# Patient Record
Sex: Male | Born: 1980 | Race: White | Hispanic: No | Marital: Single | Smoking: Never smoker
Health system: Southern US, Community
[De-identification: ages and names within clinical notes are randomized; demographics above are authoritative.]

## PROBLEM LIST (undated history)

## (undated) DIAGNOSIS — F439 Reaction to severe stress, unspecified: Secondary | ICD-10-CM

## (undated) DIAGNOSIS — K219 Gastro-esophageal reflux disease without esophagitis: Secondary | ICD-10-CM

## (undated) DIAGNOSIS — R000 Tachycardia, unspecified: Secondary | ICD-10-CM

## (undated) DIAGNOSIS — R002 Palpitations: Secondary | ICD-10-CM

## (undated) HISTORY — DX: Tachycardia, unspecified: R00.0

## (undated) HISTORY — DX: Palpitations: R00.2

---
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
## 2004-05-18 ENCOUNTER — Emergency Department (HOSPITAL_COMMUNITY): Admission: EM | Admit: 2004-05-18 | Discharge: 2004-05-18 | Payer: Self-pay | Admitting: Emergency Medicine

## 2004-09-20 ENCOUNTER — Ambulatory Visit: Payer: Self-pay | Admitting: Family Medicine

## 2004-10-23 ENCOUNTER — Ambulatory Visit: Payer: Self-pay | Admitting: Family Medicine

## 2005-01-25 ENCOUNTER — Ambulatory Visit: Payer: Self-pay | Admitting: Family Medicine

## 2005-06-04 ENCOUNTER — Ambulatory Visit: Payer: Self-pay | Admitting: Family Medicine

## 2005-12-16 IMAGING — CT CT ORBIT/TEMPORAL/IAC W/O CM
4 series · 18 of 30 positions shown, 19 images · non-contrast
Comparison: none

HISTORY: Facial trauma, a salt, pain right orbit

[Series 2: brain · axial · 0.47mm/px · z∈[+136,+221]mm · 4 of 32 slices shown, 5 images]
[im 7/32  brain]
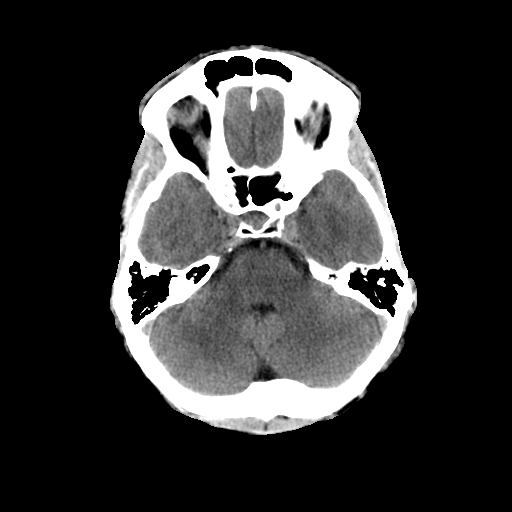
[im 7/32  bone]
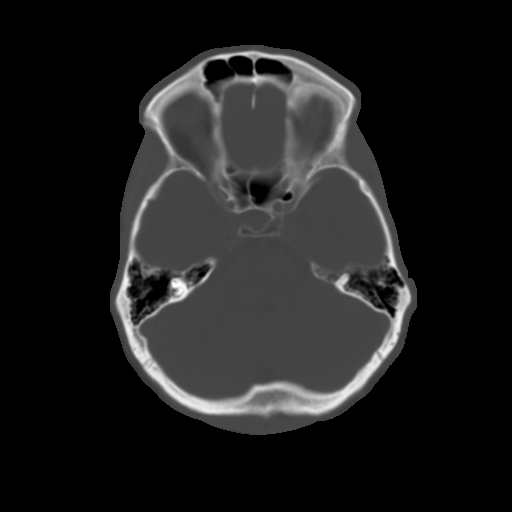
[im 13/32  bone]
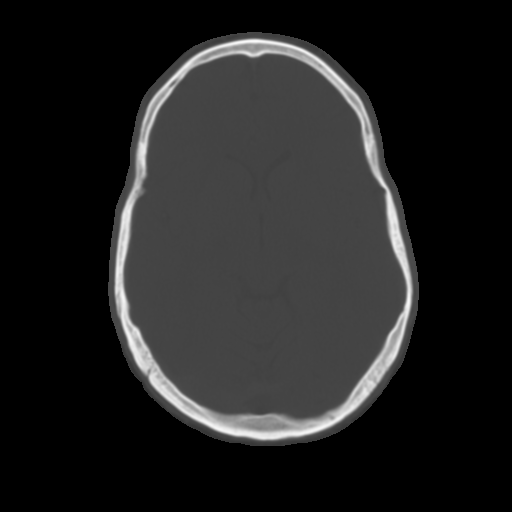
[im 19/32  bone]
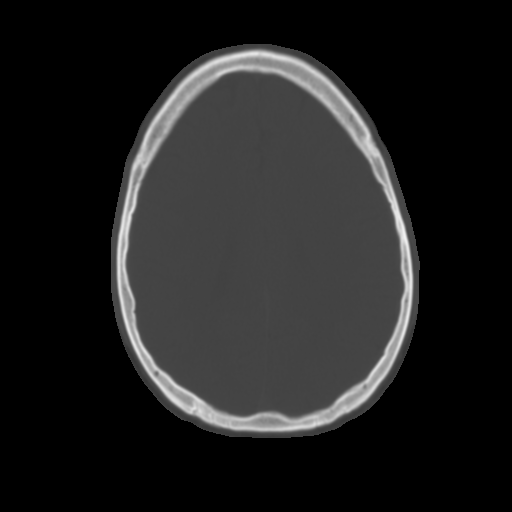
[im 25/32  bone]
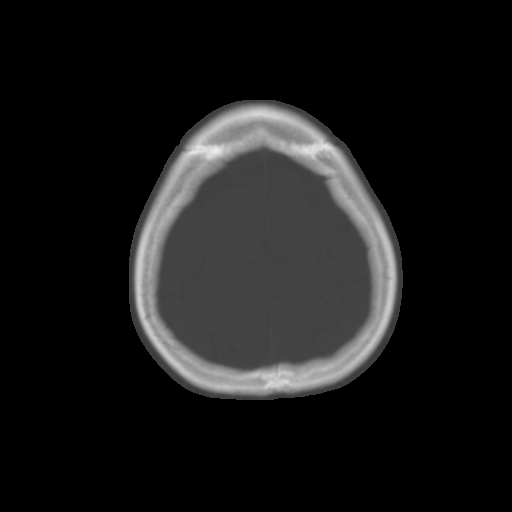

[Series 3: recon 2: brain · axial · 0.47mm/px · z∈[+136,+221]mm · 4 of 32 slices shown]
[im 7/32  bone]
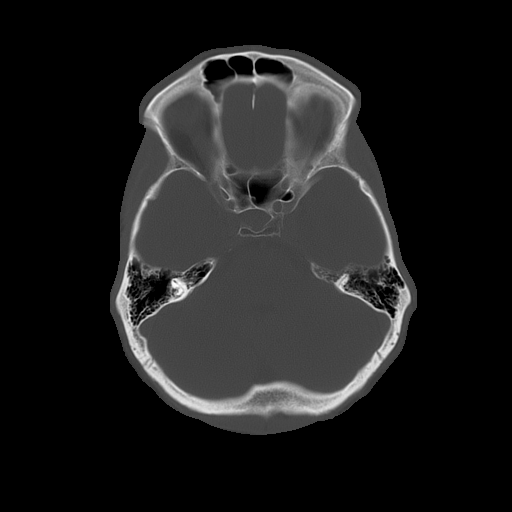
[im 13/32  bone]
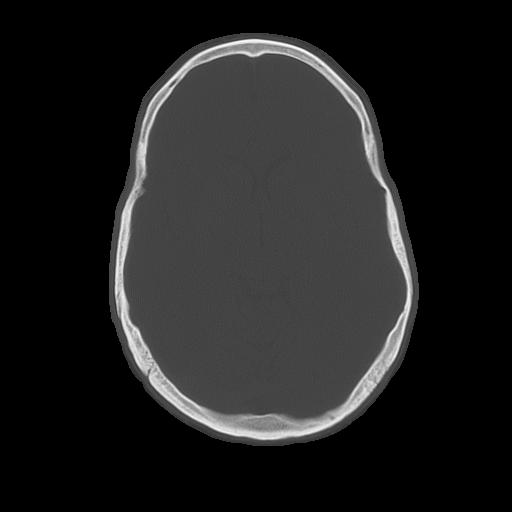
[im 19/32  bone]
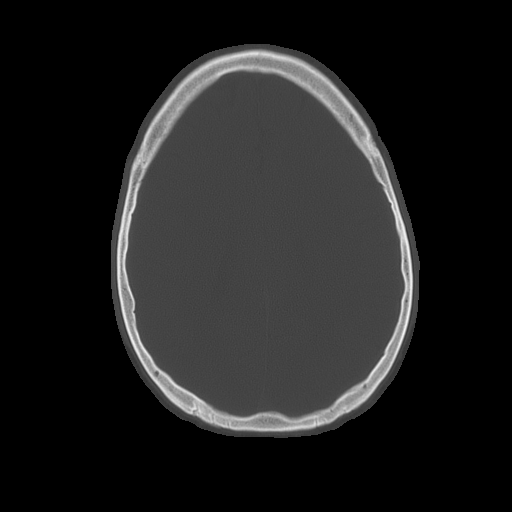
[im 25/32  bone]
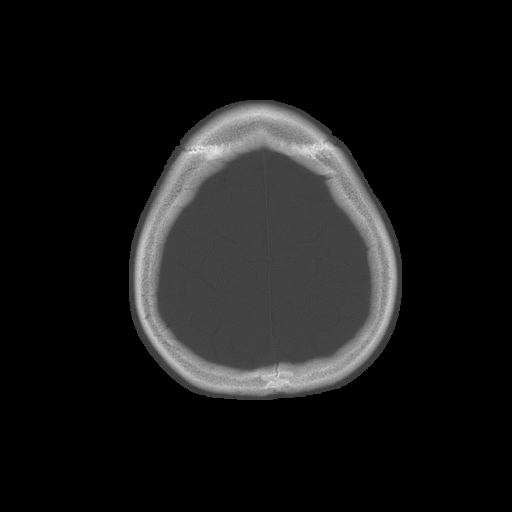

[Series 5: recon 2: supine facial bones · axial · 0.33mm/px · z∈[+56,+141]mm · 4 of 29 slices shown]
[im 6/29  bone]
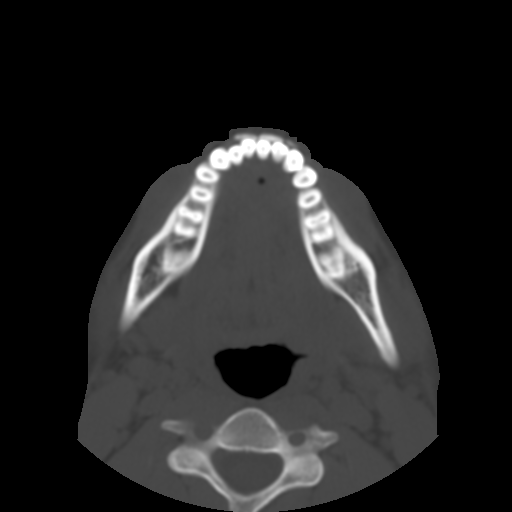
[im 12/29  bone]
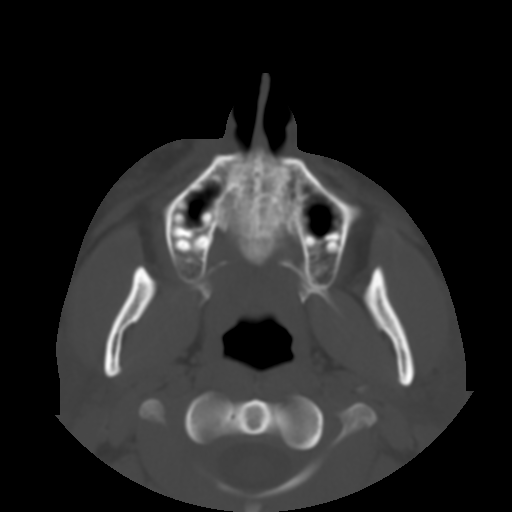
[im 17/29  bone]
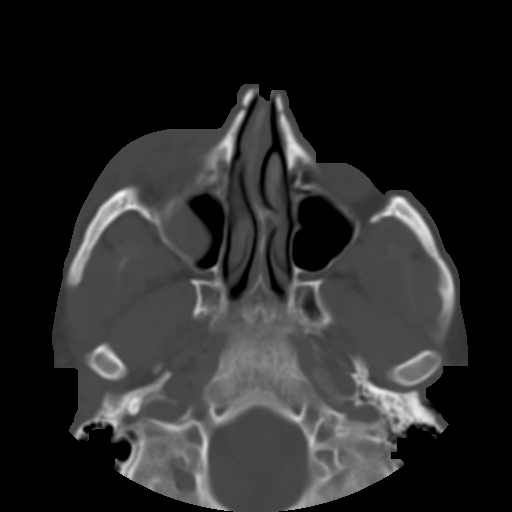
[im 23/29  bone]
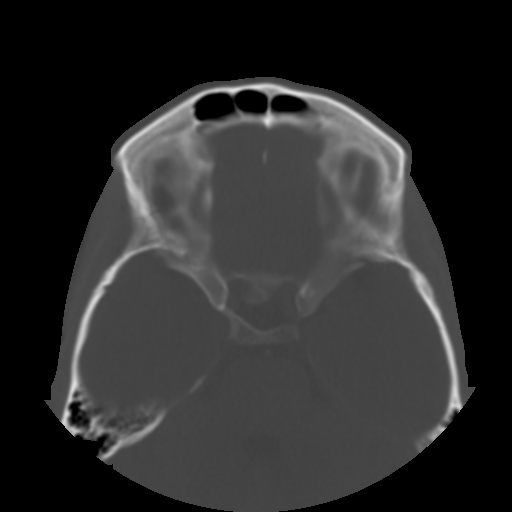

[Series 105: reformatted · coronal · 0.33mm/px · 6 of 60 slices shown]
[im 6/60  bone]
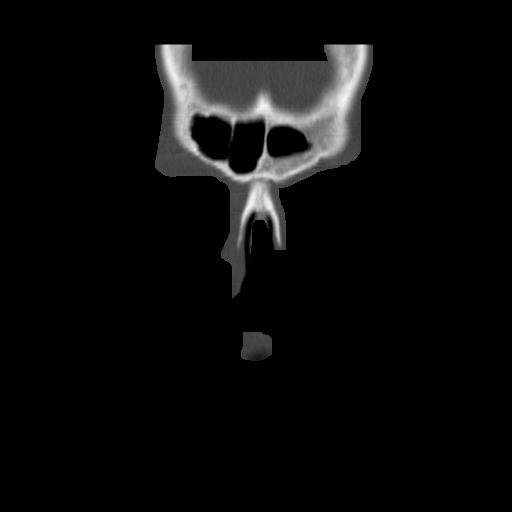
[im 11/60  bone]
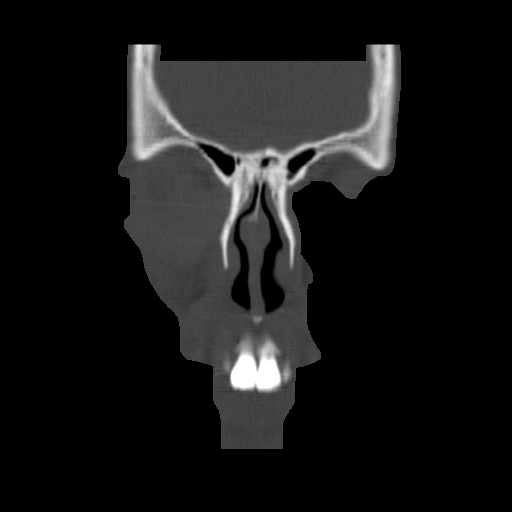
[im 22/60  bone]
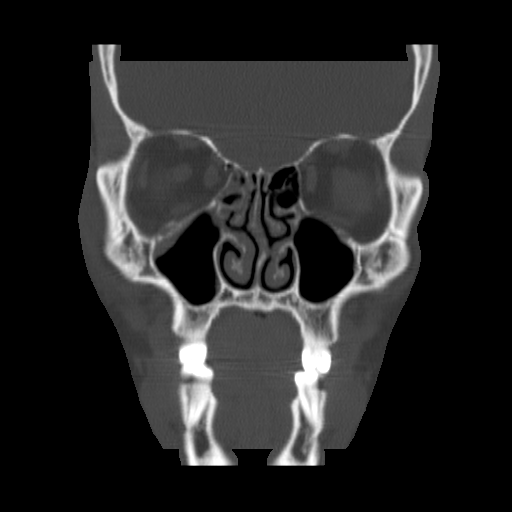
[im 27/60  bone]
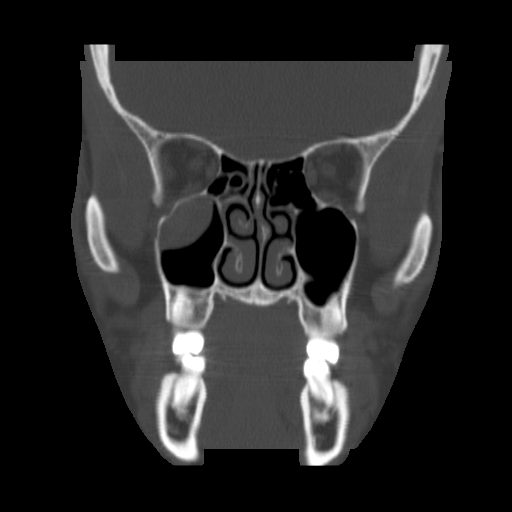
[im 33/60  bone]
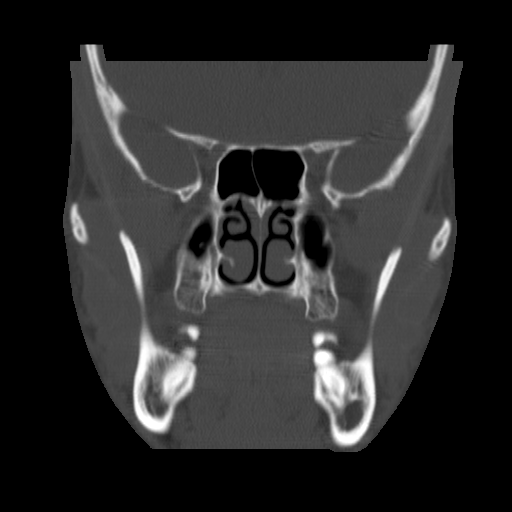
[im 38/60  bone]
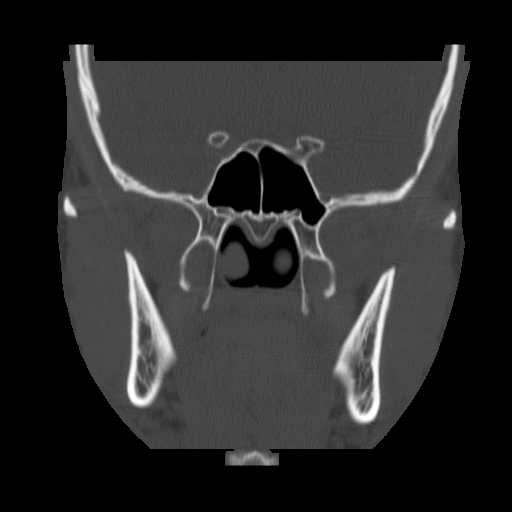

[18 of 30 positions shown; findings below may reference images not displayed]

CT ORBITS WITHOUT CONTRAST:

Axial and reconstructed coronal noncontrast CT images of the orbits performed.
Extensive periorbital soft tissue swelling right orbit.
Intraconal fat clear bilaterally.
Mildly displaced fracture of medial wall right orbit, depressed into the ethmoid
air cells.
Additional nondisplaced fracture through inferior wall right orbit into superior
and lateral aspect of maxillary sinus.
Associated soft tissue thickening, fluid, or blood in superolateral aspect of
right maxillary sinus.
Zygoma is intact.
Remaining orbital walls intact.
Mucosal thickening in right ethmoid air cells.
Left orbit intact and remaining sinuses clear.
IMPRESSION: Fractures of the medial and inferior walls of right orbit as above.
Extensive right periorbital and facial soft tissue swelling.

## 2010-08-20 ENCOUNTER — Ambulatory Visit (HOSPITAL_BASED_OUTPATIENT_CLINIC_OR_DEPARTMENT_OTHER)
Admission: RE | Admit: 2010-08-20 | Discharge: 2010-08-20 | Disposition: A | Discharge status: Home / Self Care | Payer: BC Managed Care – PPO | Source: Ambulatory Visit | Attending: Orthopedic Surgery | Admitting: Orthopedic Surgery

## 2010-08-20 DIAGNOSIS — W19XXXA Unspecified fall, initial encounter: Secondary | ICD-10-CM | POA: Insufficient documentation

## 2010-08-20 DIAGNOSIS — S62309A Unspecified fracture of unspecified metacarpal bone, initial encounter for closed fracture: Secondary | ICD-10-CM | POA: Insufficient documentation

## 2010-08-20 DIAGNOSIS — Z01812 Encounter for preprocedural laboratory examination: Secondary | ICD-10-CM | POA: Insufficient documentation

## 2010-08-20 LAB — POCT HEMOGLOBIN-HEMACUE: Hemoglobin: 16.2 g/dL (ref 13.0–17.0)

## 2010-08-25 NOTE — Op Note (Signed)
NAMEKANAAN, Franklin Adams NO.:  [id number]  MEDICAL RECORD NO.:  [id number]           PATIENT TYPE:  LOCATION:                                 FACILITY:  PHYSICIAN:  Betha Loa, MD             DATE OF BIRTH:  DATE OF PROCEDURE:  08/20/2010 DATE OF DISCHARGE:                              OPERATIVE REPORT   PREOPERATIVE DIAGNOSIS:  Right long and ring finger metacarpal fractures.  POSTOPERATIVE DIAGNOSIS:  Right long and ring finger metacarpal fractures.  PROCEDURE:  Open reduction and internal fixation right long and ring finger metacarpal fracture.  SURGEON:  Betha Loa, MD  ASSISTANTS:  None.  ANESTHESIA:  General.  IV FLUIDS:  Per anesthesia flow sheet.  ESTIMATED BLOOD LOSS:  Minimal.  COMPLICATIONS:  None.  SPECIMENS:  None.  TOURNIQUET TIME:  109 minutes.  DISPOSITION:  Stable to PACU.  INDICATIONS:  Franklin Adams is a 30 year old white male who on August 16, 2010, fell onto his fist.  He had a pop in his right hand.  He went to Dr. Debby Bud office where he was evaluated and found to have long and ring finger metacarpal fractures.  He was referred to me for further care. On evaluation, he had intact sensation and capillary refill in all fingertips.  He could flex and extend the IP joint of the thumb across fingers.  He had swelling of the hand.  On radiographs, he had a oblique fracture of the mid shaft of the long finger metacarpal and an oblique fracture at the base of his ring finger metacarpal.  Recommended Franklin Adams going to the operating room for open reduction and internal fixation of the fractures.  Risks, benefits and alternatives of the surgery were discussed including risk of blood loss, infection, damage to nerves, vessels, tendons, ligaments, bone, failure of surgery, need for additional surgery, complications with wound healing, continued pain, nonunion, malunion and stiffness.  He voiced understanding of these risks and elected to  proceed.  OPERATIVE COURSE:  After being identified preoperatively by myself, the patient and I agreed upon procedure and site of procedure.  Surgical site was marked.  Risks, benefits and alternatives of surgery were reviewed and he wished to proceed.  Surgical consent has been signed. He was given 1 gram of IV Ancef as a preoperative antibiotic prophylaxis.  He was transported to the operating room and placed on the operating room table in supine position with the right upper extremity on arm board.  General anesthesia was induced by the anesthesiologist. Right upper extremity was prepped and draped in normal sterile orthopedic fashion.  A surgical pause was performed between surgeons, Anesthesia, operating room staff and all were in agreement as to the patient, procedure and site of procedure.  Tourniquet at the proximal aspect of the extremity was inflated to 250 mmHg after exsanguination of the limb with an Esmarch bandage.  An incision was made between the long and ring finger metacarpals and carried down to the subcutaneous tissues by spreading technique.  Bipolar was used for hemostasis throughout the case.  The long and ring finger EDC tendons were identified and retracted.  There was a juncturae between them that was able to be left intact throughout the case.  The periosteum over the long finger metacarpal was incised sharply and elevated using a periosteal elevator. Fracture site was identified easily.  It was cleared of hematoma using the curette.  The fracture was reduced under direct visualization.  C- arm was used in AP, lateral and oblique projections to ensure appropriate reduction which was the case.  A 6-hole 2.0-mm plate from the modular hand set was selected.  This was placed over the fracture with three holes proximal and distal.  The holes were drilled and measured using standard AO technique.  Two screws were placed in the most proximal holes and then two screws  in the most distal holes. Again, care was taken to ensure appropriate reduction which was the case.  The remaining two holes were filled.  All nonlocking screws were used.  Good stabilization at the fracture and good purchase in all screws was obtained.  Attention was turned to the ring finger metacarpal.  Incision was made in the periosteum and again the periosteum was elevated using the periosteal elevator.  The fracture was at the metaphyseal-diaphyseal junction of the base of the metacarpal.  A T-shaped plate from the 1.6-XW modular handset was selected.  This allowed two locking screws in the T portion of the plate proximal to the fracture.  These were placed first.  C-arm was used to ensure appropriate reduction and placement of hardware which was the case.  The three holes at the distal aspect of the plate which had been cut were then placed using standard cortical screws.  The remaining hole at the proximal end was placed and this was able to be placed in a lag fashion into the base of the ring finger metacarpal.  Good purchase was obtained in all screws.  The C-arm was again used in AP, lateral and oblique projections to ensure appropriate reduction of fractures and placement of hardware which was the case.  The wrist was placed through a range of motion and tenodesis showed that there was no scissoring of the fingers. The wound was copiously irrigated with 500 mL of sterile saline. Periosteum was repaired over top of the plates.  The skin was closed using 4-0 nylon in a horizontal mattress fashion.  The wound was injected with 10 mL of 0.25% plain Marcaine to aid in postoperative analgesia.  The wound was dressed with sterile Xeroform, 4x4s and wrapped with a Kerlix bandage.  A volar and dorsal blocking splint including the long, ring and small fingers was placed with the MPs flexed and the IPs extended.  This was wrapped with Kerlix and Ace bandage.  The tourniquet was deflated  at 109 minutes.  The fingertips were pink with brisk capillary refill after deflation of the tourniquet. The operative drape was broken down.  The patient was awoken from anesthesia safely.  He was transferred back to the stretcher and taken to the PACU in stable condition.  I will see him in the office in 1 week for postoperative followup.  We will give him Percocet 5/325 one to two p.o. q.6 h. p.r.n. pain dispensed #50.     Betha Loa, MD     KK/MEDQ  D:  08/20/2010  T:  08/21/2010  Job:  [id number]  Electronically Signed by Betha Loa  on 08/25/2010 03:00:39 PM

## 2017-02-05 ENCOUNTER — Telehealth: Payer: Self-pay | Admitting: Cardiology

## 2017-02-05 NOTE — Telephone Encounter (Signed)
Created in error

## 2018-12-15 ENCOUNTER — Encounter: Payer: Self-pay | Admitting: *Deleted

## 2018-12-15 ENCOUNTER — Encounter: Payer: Self-pay | Admitting: Cardiology

## 2018-12-15 DIAGNOSIS — F439 Reaction to severe stress, unspecified: Secondary | ICD-10-CM

## 2018-12-15 DIAGNOSIS — R079 Chest pain, unspecified: Secondary | ICD-10-CM

## 2018-12-15 DIAGNOSIS — R002 Palpitations: Secondary | ICD-10-CM | POA: Insufficient documentation

## 2018-12-15 DIAGNOSIS — K219 Gastro-esophageal reflux disease without esophagitis: Secondary | ICD-10-CM

## 2018-12-15 HISTORY — DX: Reaction to severe stress, unspecified: F43.9

## 2018-12-15 HISTORY — DX: Gastro-esophageal reflux disease without esophagitis: K21.9

## 2018-12-15 HISTORY — DX: Chest pain, unspecified: R07.9

## 2018-12-16 ENCOUNTER — Encounter: Payer: Self-pay | Admitting: Cardiology

## 2018-12-16 ENCOUNTER — Ambulatory Visit (INDEPENDENT_AMBULATORY_CARE_PROVIDER_SITE_OTHER): Payer: 59 | Admitting: Cardiology

## 2018-12-16 ENCOUNTER — Other Ambulatory Visit: Payer: Self-pay

## 2018-12-16 VITALS — BP 128/64 | HR 82 | Ht 69.0 in | Wt 177.2 lb

## 2018-12-16 DIAGNOSIS — K219 Gastro-esophageal reflux disease without esophagitis: Secondary | ICD-10-CM

## 2018-12-16 DIAGNOSIS — R079 Chest pain, unspecified: Secondary | ICD-10-CM

## 2018-12-16 DIAGNOSIS — R000 Tachycardia, unspecified: Secondary | ICD-10-CM | POA: Diagnosis not present

## 2018-12-16 NOTE — Patient Instructions (Addendum)
Medication Instructions:  Your physician recommends that you continue on your current medications as directed. Please refer to the Current Medication list given to you today.  If you need a refill on your cardiac medications before your next appointment, please call your pharmacy.   Lab work: None  If you have labs (blood work) drawn today and your tests are completely normal, you will receive your results only by: Marland Kitchen. MyChart Message (if you have MyChart) OR . A paper copy in the mail If you have any lab test that is abnormal or we need to change your treatment, we will call you to review the results.  Testing/Procedures: You had an EKG today.   Your physician has requested that you have an echocardiogram. Echocardiography is a painless test that uses sound waves to create images of your heart. It provides your doctor with information about the size and shape of your heart and how well your heart's chambers and valves are working. This procedure takes approximately one hour. There are no restrictions for this procedure.  Follow-Up: At [facility], you and your health needs are our priority.  As part of our continuing mission to provide you with exceptional heart care, we have created designated Provider Care Teams.  These Care Teams include your primary Cardiologist (physician) and Advanced Practice Providers (APPs -  Physician Assistants and Nurse Practitioners) who all work together to provide you with the care you need, when you need it. You will need a follow up appointment in 6 weeks.        1. Avoid all over-the-counter antihistamines except Claritin/Loratadine and Zyrtec/Cetrizine. 2. Avoid all combination including cold sinus allergies flu decongestant and sleep medications 3. You can use Robitussin DM Mucinex and Mucinex DM for cough. 4. can use Tylenol aspirin ibuprofen and naproxen but no combinations such as sleep or sinus.      Echocardiogram An echocardiogram is a  procedure that uses painless sound waves (ultrasound) to produce an image of the heart. Images from an echocardiogram can provide important information about:  Signs of coronary artery disease (CAD).  Aneurysm detection. An aneurysm is a weak or damaged part of an artery wall that bulges out from the normal force of blood pumping through the body.  Heart size and shape. Changes in the size or shape of the heart can be associated with certain conditions, including heart failure, aneurysm, and CAD.  Heart muscle function.  Heart valve function.  Signs of a past heart attack.  Fluid buildup around the heart.  Thickening of the heart muscle.  A tumor or infectious growth around the heart valves. Tell a health care provider about:  Any allergies you have.  All medicines you are taking, including vitamins, herbs, eye drops, creams, and over-the-counter medicines.  Any blood disorders you have.  Any surgeries you have had.  Any medical conditions you have.  Whether you are pregnant or may be pregnant. What are the risks? Generally, this is a safe procedure. However, problems may occur, including:  Allergic reaction to dye (contrast) that may be used during the procedure. What happens before the procedure? No specific preparation is needed. You may eat and drink normally. What happens during the procedure?   An IV tube may be inserted into one of your veins.  You may receive contrast through this tube. A contrast is an injection that improves the quality of the pictures from your heart.  A gel will be applied to your chest.  A wand-like  tool (transducer) will be moved over your chest. The gel will help to transmit the sound waves from the transducer.  The sound waves will harmlessly bounce off of your heart to allow the heart images to be captured in real-time motion. The images will be recorded on a computer. The procedure may vary among health care providers and  hospitals. What happens after the procedure?  You may return to your normal, everyday life, including diet, activities, and medicines, unless your health care provider tells you not to do that. Summary  An echocardiogram is a procedure that uses painless sound waves (ultrasound) to produce an image of the heart.  Images from an echocardiogram can provide important information about the size and shape of your heart, heart muscle function, heart valve function, and fluid buildup around your heart.  You do not need to do anything to prepare before this procedure. You may eat and drink normally.  After the echocardiogram is completed, you may return to your normal, everyday life, unless your health care provider tells you not to do that. This information is not intended to replace advice given to you by your health care provider. Make sure you discuss any questions you have with your health care provider. Document Released: 04/26/2000 Document Revised: 08/20/2018 Document Reviewed: 06/01/2016 Elsevier Patient Education  2020 [employer].

## 2018-12-16 NOTE — Progress Notes (Signed)
Cardiology Office Note:    Date:  12/16/2018   ID:  Franklin Adams, DOB October 28, 1980, MRN [id number]  PCP:  Helen Hashimoto., MD  Cardiologist:  Shirlee More, MD   Referring MD: Helen Hashimoto., MD  ASSESSMENT:    1. Chest pain in adult   2. Tachycardia   3. Gastroesophageal reflux disease without esophagitis    PLAN:    In order of problems listed above:  1. Chest pain -he is not in high risk group his chest pain is quite fleeting atypical improved with a PPI and at this time I would not advise an ischemia evaluation 2. Palpitation-ongoing he has not had recent episodes in the last few days he has a iPhone adapter to record heart rhythm and to use it for the next 6 weeks check echocardiogram to exclude cardiomyopathy avoid over-the-counter proarrhythmic drugs and I will see him back in the office to review 3. GERD -improved with PPI continue the same  Next appointment 6 weeks  Medication Adjustments/Labs and Tests Ordered: Current medicines are reviewed at length with the patient today.  Concerns regarding medicines are outlined above.  Orders Placed This Encounter  Procedures   EKG 12-Lead   ECHOCARDIOGRAM COMPLETE   No orders of the defined types were placed in this encounter.    Chief Complaint  Patient presents with   Atrial Fibrillation   Chest Pain    History of Present Illness:    Franklin Adams is a 38 y.o. male who is being seen today for the evaluation of palpitation and chest pain at the request of Helen Hashimoto., MD. Past medical history GERD. He is a never smoker.   For a few weeks he has been having episodes where his pulse feels erratic it occurs in the early morning occurs at night not with activity no exercise intolerance exertional chest pain shortness of breath or syncope.  He takes over-the-counter Zyrtec no other proarrhythmic drugs.  He was having hoarseness and throat pain underwent endoscopy by ENT in the office and since placed  on a PPI has been improved he has had a couple fleeting episodes of momentary nonexertional chest pain.  Overall his symptoms have mitigated in the last weeks.  He is already purchased the iPhone adapter to record heart rhythm or can use it for the next 6 weeks echocardiogram to exclude structural heart disease and after a nice discussion we do not feel the need to do an ischemia evaluation.   Past Medical History:  Diagnosis Date   GERD (gastroesophageal reflux disease) 12/15/2018   Stress 12/15/2018   Tachycardia     Past Surgical History:  Procedure Laterality Date   HAND SURGERY     LEG SURGERY      Current Medications: Current Meds  Medication Sig   esomeprazole (NEXIUM) 20 MG capsule Take 20 mg by mouth daily at 12 noon.     Allergies:   Patient has no known allergies.   Social History   Socioeconomic History   Marital status: Single    Spouse name: Not on file   Number of children: Not on file   Years of education: Not on file   Highest education level: Not on file  Occupational History   Not on file  Social Needs   Financial resource strain: Not on file   Food insecurity    Worry: Not on file    Inability: Not on file   Transportation needs  Medical: Not on file    Non-medical: Not on file  Tobacco Use   Smoking status: Never Smoker   Smokeless tobacco: Former NeurosurgeonUser  Substance and Sexual Activity   Alcohol use: Yes    Comment: occasional   Drug use: Not Currently   Sexual activity: Not on file  Lifestyle   Physical activity    Days per week: Not on file    Minutes per session: Not on file   Stress: Not on file  Relationships   Social connections    Talks on phone: Not on file    Gets together: Not on file    Attends religious service: Not on file    Active member of club or organization: Not on file    Attends meetings of clubs or organizations: Not on file    Relationship status: Not on file  Other Topics Concern   Not on  file  Social History Narrative   Not on file     Family History: The patient's family history includes Other in his mother; Prostate cancer in his maternal grandfather.  ROS:   Review of Systems  Constitution: Negative.  HENT: Negative.   Eyes: Negative.   Cardiovascular: Positive for chest pain and palpitations.  Respiratory: Negative.   Endocrine: Negative.   Hematologic/Lymphatic: Negative.   Skin: Negative.   Musculoskeletal: Negative.   Gastrointestinal: Positive for heartburn.  Genitourinary: Negative.   Neurological: Negative.   Psychiatric/Behavioral: Negative.   Allergic/Immunologic: Negative.    Please see the history of present illness.     All other systems reviewed and are negative.  EKGs/Labs/Other Studies Reviewed:    The following studies were reviewed today:   EKG:  EKG is  ordered today.  The ekg ordered today is personally reviewed and demonstrates sinus rhythm and is normal  Recent Labs:  CMp and TSH order yesterday pending No results found for requested labs within last 8760 hours.  Recent Lipid Panel No results found for: CHOL, TRIG, HDL, CHOLHDL, VLDL, LDLCALC, LDLDIRECT  Physical Exam:    VS:  BP 128/64    Pulse 82    Ht 5\' 9"  (1.753 m)    Wt 177 lb 3.2 oz (80.4 kg)    SpO2 98%    BMI 26.17 kg/m     Wt Readings from Last 3 Encounters:  12/16/18 177 lb 3.2 oz (80.4 kg)  12/15/18 173 lb (78.5 kg)     GEN: \ Well nourished, well developed in no acute distress HEENT: Normal NECK: No JVD; No carotid bruits LYMPHATICS: No lymphadenopathy CARDIAC: No chest wall tenderness RRR, no murmurs, rubs, gallops RESPIRATORY:  Clear to auscultation without rales, wheezing or rhonchi  ABDOMEN: Soft, non-tender, non-distended MUSCULOSKELETAL:  No edema; No deformity  SKIN: Warm and dry NEUROLOGIC:  Alert and oriented x 3 PSYCHIATRIC:  Normal affect     Signed, Norman HerrlichBrian Ignatz Deis, MD  12/16/2018 1:50 PM    [facility]

## 2018-12-31 ENCOUNTER — Other Ambulatory Visit: Payer: 59

## 2020-06-23 ENCOUNTER — Other Ambulatory Visit: Payer: Self-pay

## 2020-06-23 ENCOUNTER — Ambulatory Visit (INDEPENDENT_AMBULATORY_CARE_PROVIDER_SITE_OTHER): Payer: 59 | Admitting: Cardiology

## 2020-06-23 ENCOUNTER — Ambulatory Visit (INDEPENDENT_AMBULATORY_CARE_PROVIDER_SITE_OTHER): Payer: 59

## 2020-06-23 VITALS — BP 132/82 | HR 71 | Ht 69.0 in | Wt 177.4 lb

## 2020-06-23 DIAGNOSIS — R079 Chest pain, unspecified: Secondary | ICD-10-CM

## 2020-06-23 DIAGNOSIS — R002 Palpitations: Secondary | ICD-10-CM

## 2020-06-23 DIAGNOSIS — R000 Tachycardia, unspecified: Secondary | ICD-10-CM | POA: Insufficient documentation

## 2020-06-23 NOTE — Patient Instructions (Signed)
Medication Instructions:  Your physician recommends that you continue on your current medications as directed. Please refer to the Current Medication list given to you today. *If you need a refill on your cardiac medications before your next appointment, please call your pharmacy*   Lab Work: Your physician recommends that you return for lab work in: the next few days. You need to have labs done when you are fasting.  You can come Monday through Friday 8:30 am to 12:00 pm and 1:15 to 4:30. You do not need to make an appointment as the order has already been placed. The labs you are going to have done are BMET, CBC, TSH, LFT and Lipids.  If you have labs (blood work) drawn today and your tests are completely normal, you will receive your results only by: Marland Kitchen MyChart Message (if you have MyChart) OR . A paper copy in the mail If you have any lab test that is abnormal or we need to change your treatment, we will call you to review the results.   Testing/Procedures: Your physician has requested that you have a stress echocardiogram. For further information please visit [email or web address] Please follow instruction sheet as given.  You must have a COVID test within 7 days of the stress echo.    WHY IS MY DOCTOR PRESCRIBING ZIO? The Zio system is proven and trusted by physicians to detect and diagnose irregular heart rhythms -- and has been prescribed to hundreds of thousands of patients.  The FDA has cleared the Zio system to monitor for many different kinds of irregular heart rhythms. In a study, physicians were able to reach a diagnosis 90% of the time with the Zio system1.  You can wear the Zio monitor -- a small, discreet, comfortable patch -- during your normal day-to-day activity, including while you sleep, shower, and exercise, while it records every single heartbeat for analysis.  1Barrett, P., et al. Comparison of 24 Hour Holter Monitoring Versus 14 Day Novel Adhesive Patch  Electrocardiographic Monitoring. American Journal of Medicine, 2014.  ZIO VS. HOLTER MONITORING The Zio monitor can be comfortably worn for up to 14 days. Holter monitors can be worn for 24 to 48 hours, limiting the time to record any irregular heart rhythms you may have. Zio is able to capture data for the 51% of patients who have their first symptom-triggered arrhythmia after 48 hours.1  LIVE WITHOUT RESTRICTIONS The Zio ambulatory cardiac monitor is a small, unobtrusive, and water-resistant patch--you might even forget you're wearing it. The Zio monitor records and stores every beat of your heart, whether you're sleeping, working out, or showering.  Wear the monitor for 1 week, remove 06/30/20. Follow-Up: At [facility], you and your health needs are our priority.  As part of our continuing mission to provide you with exceptional heart care, we have created designated Provider Care Teams.  These Care Teams include your primary Cardiologist (physician) and Advanced Practice Providers (APPs -  Physician Assistants and Nurse Practitioners) who all work together to provide you with the care you need, when you need it.  We recommend signing up for the patient portal called "MyChart".  Sign up information is provided on this After Visit Summary.  MyChart is used to connect with patients for Virtual Visits (Telemedicine).  Patients are able to view lab/test results, encounter notes, upcoming appointments, etc.  Non-urgent messages can be sent to your provider as well.   To learn more about what you can do with MyChart, go to [email or web address].  Your next appointment:   6 month(s)  The format for your next appointment:   In Person  Provider:   Belva Crome, MD   Other Instructions  Exercise Stress Echocardiogram An exercise stress echocardiogram is a test to check how well your heart is working. This test uses sound waves and a computer to make pictures of your heart. These  pictures will be taken before and after you exercise. For this test, you will walk on a treadmill or ride a bicycle to make your heart beat faster. While you exercise, your heart will be checked with an electrocardiogram (ECG). Your blood pressure will also be checked. You may have this test if:  You have chest pain or a heart problem.  You had a heart attack or heart surgery not long ago.  You have heart valve problems.  You have a condition that causes narrowing of the blood vessels that supply your heart.  You have a high risk of heart disease and: ? You are starting a new exercise program. ? You need to have a big surgery. Tell a doctor about:  Any allergies you have.  All medicines you are taking. This includes vitamins, herbs, eye drops, creams, and over-the-counter medicines.  Any problems you or family members have had with medicines that make you fall asleep (anesthetic medicines).  Any surgeries you have had.  Any blood disorders you have.  Any medical conditions you have.  Whether you are pregnant or may be pregnant. What are the risks? Generally, this is a safe test. However, problems may occur, including:  Chest pain.  Feeling dizzy or light-headed.  Shortness of breath.  Increased or irregular heartbeat.  Feeling like you may vomit (nausea) or vomiting.  Heart attack. This is very rare. What happens before the test? Medicines  Ask your doctor about changing or stopping your normal medicines. This is important if you take diabetes medicines or blood thinners.  If you use an inhaler, bring it to the test. General instructions  Wear comfortable clothes and walking shoes.  Follow instructions from your doctor about what you cannot eat or drink before the test.  Do not drink or eat anything that has caffeine in it. Stop having caffeine 24 hours before the test.  Do not smoke or use products that contain nicotine or tobacco for 4 hours before the  test. If you need help quitting, ask your doctor. What happens during the test?  You will take off your clothes from the waist up and put on a hospital gown.  Electrodes or patches will be put on your chest.  A blood pressure cuff will be put on your arm.  Before you exercise, a computer will make a picture of your heart. To do this: ? You will lie down and a gel will be put on your chest. ? A wand will be moved over the gel. ? Sound waves from the wand will go to the computer to make the picture.  Then, you will start to exercise. You may walk on a treadmill or pedal a bicycle.  Your blood pressure and heart rhythm will be checked while you exercise.  The exercise will get harder or faster.  You will exercise until: ? Your heart reaches a certain level. ? You are too tired to go on. ? You cannot go on because of chest pain, weakness, or dizziness.  You will lie down right away so another picture of your heart can be taken. The procedure  may vary among doctors and hospitals.   What can I expect after the test?  After your test, it is common to have: ? Mild soreness. ? Mild tiredness. Your heart rate and blood pressure will be checked until they return to your normal levels. You should not have any new symptoms after this test. Follow these instructions at home:  If your doctor says that you can, you may: ? Eat what you normally eat. ? Do your normal activities.  Take over-the-counter and prescription medicines only as told by your doctor.  Keep all follow-up visits.  It is up to you to get the results of your test. Ask how to get your results when they are ready. Contact a doctor if:  You feel dizzy or light-headed.  You have a fast or irregular heartbeat.  You feel like you may vomit or you vomit.  You have a headache.  You feel short of breath. Get help right away if:  You develop pain or pressure: ? In your chest. ? In your jaw or neck. ? Between your  shoulders. ? That goes down your left arm.  You faint.  You have trouble breathing. These symptoms may be an emergency. Get medical help right away. Call your local emergency services (911 in the [place]  Do not wait to see if the symptoms will go away.  Do not drive yourself to the hospital. Summary  This is a test that checks how well your heart is working.  Follow instructions about what you cannot eat or drink before the test. Ask your doctor if you should take your normal medicines before the test.  Stop having caffeine 24 hours before the test.  Do not smoke or use products with nicotine or tobacco in them for 4 hours before the test.  During the test, your blood pressure and heart rhythm will be checked while you exercise. This information is not intended to replace advice given to you by your health care provider. Make sure you discuss any questions you have with your health care provider. Document Revised: 12/21/2019 Document Reviewed: 12/21/2019 Elsevier Patient Education  2021 [employer].

## 2020-06-23 NOTE — Progress Notes (Signed)
Cardiology Office Note:    Date:  06/23/2020   ID:  Franklin Adams, DOB 10/11/1980, MRN [id number]  PCP:  Wilmer Floor., MD  Cardiologist:  Garwin Brothers, MD   Referring MD: Wilmer Floor., MD    ASSESSMENT:    1. Chest pain in adult   2. Palpitations    PLAN:    In order of problems listed above:  1. Primary prevention stressed with the patient.  Importance of compliance with diet medication stressed any vocalized understanding. 2. Chest pain: Atypical in etiology however this has been concerning him.  He wants to exercise but is holding off on it because of this chest pain issues.  We will do a stress echo to assess this. 3. Palpitations: He has not had blood work for quite some time we will do complete blood work including CBC and TSH.  I will also put a monitor for 1 week to assess the symptoms to see if any significant arrhythmias.  I doubt any of this may be happening in view of his symptom profile.  They appear benign. 4. Cholesterol screening: We will get fasting lipids to do stratify him from a vascular standpoint. 5. Patient will be seen in follow-up appointment in 6 months or earlier if the patient has any concerns    Medication Adjustments/Labs and Tests Ordered: Current medicines are reviewed at length with the patient today.  Concerns regarding medicines are outlined above.  No orders of the defined types were placed in this encounter.  No orders of the defined types were placed in this encounter.    No chief complaint on file.    History of Present Illness:    Franklin Adams is a 40 y.o. male.  Patient has past medical history that is not much significant.  He mentions to me that he had skipped beat sensation in the past and he was not evaluated.  Because of the pandemic.  He is here for follow-up.  He mentions to me a sensation of hard heartbeat and palpitations at times.  No orthopnea PND no syncope or any dizziness.  At the time of my  evaluation, the patient is alert awake oriented and in no distress.  He gives history of chest discomfort but this is not related to exertion.  However it concerns him.  Past Medical History:  Diagnosis Date  . Chest pain in adult 12/15/2018  . GERD (gastroesophageal reflux disease) 12/15/2018  . Palpitations   . Stress 12/15/2018  . Tachycardia     Past Surgical History:  Procedure Laterality Date  . HAND SURGERY    . LEG SURGERY      Current Medications: No outpatient medications have been marked as taking for the 06/23/20 encounter (Office Visit) with Dantae Meunier, Aundra Dubin, MD.     Allergies:   Patient has no known allergies.   Social History   Socioeconomic History  . Marital status: Single    Spouse name: Not on file  . Number of children: Not on file  . Years of education: Not on file  . Highest education level: Not on file  Occupational History  . Not on file  Tobacco Use  . Smoking status: Never Smoker  . Smokeless tobacco: Former Clinical biochemist  . Vaping Use: Never used  Substance and Sexual Activity  . Alcohol use: Yes    Comment: occasional  . Drug use: Not Currently  . Sexual activity: Not on file  Other Topics Concern  . Not on file  Social History Narrative  . Not on file   Social Determinants of Health   Financial Resource Strain: Not on file  Food Insecurity: Not on file  Transportation Needs: Not on file  Physical Activity: Not on file  Stress: Not on file  Social Connections: Not on file     Family History: The patient's family history includes Other in his mother; Prostate cancer in his maternal grandfather.  ROS:   Please see the history of present illness.    All other systems reviewed and are negative.  EKGs/Labs/Other Studies Reviewed:    The following studies were reviewed today: EKG reveals sinus rhythm and nonspecific ST-T changes   Recent Labs: No results found for requested labs within last 8760 hours.  Recent Lipid Panel No  results found for: CHOL, TRIG, HDL, CHOLHDL, VLDL, LDLCALC, LDLDIRECT  Physical Exam:    VS:  BP 132/82   Pulse 71   Ht 5\' 9"  (1.753 m)   Wt 177 lb 6.4 oz (80.5 kg)   SpO2 95%   BMI 26.20 kg/m     Wt Readings from Last 3 Encounters:  06/23/20 177 lb 6.4 oz (80.5 kg)  12/16/18 177 lb 3.2 oz (80.4 kg)  12/15/18 173 lb (78.5 kg)     GEN: Patient is in no acute distress HEENT: Normal NECK: No JVD; No carotid bruits LYMPHATICS: No lymphadenopathy CARDIAC: Hear sounds regular, 2/6 systolic murmur at the apex. RESPIRATORY:  Clear to auscultation without rales, wheezing or rhonchi  ABDOMEN: Soft, non-tender, non-distended MUSCULOSKELETAL:  No edema; No deformity  SKIN: Warm and dry NEUROLOGIC:  Alert and oriented x 3 PSYCHIATRIC:  Normal affect   Signed, 02/14/19, MD  06/23/2020 3:36 PM    [facility]

## 2020-06-28 ENCOUNTER — Encounter: Payer: Self-pay | Admitting: Cardiology

## 2020-06-28 DIAGNOSIS — R079 Chest pain, unspecified: Secondary | ICD-10-CM | POA: Diagnosis not present

## 2020-07-04 ENCOUNTER — Telehealth: Payer: Self-pay

## 2020-07-04 NOTE — Telephone Encounter (Signed)
Stress echocardiogram done at [facility] was normal. Per DPR detailed message left on VM with results as reviewed by Dr. Tomie China.

## 2021-06-19 ENCOUNTER — Other Ambulatory Visit: Payer: Self-pay | Admitting: Cardiology

## 2021-06-19 DIAGNOSIS — R079 Chest pain, unspecified: Secondary | ICD-10-CM

## 2021-06-19 DIAGNOSIS — R002 Palpitations: Secondary | ICD-10-CM

## 2022-10-22 DIAGNOSIS — H52223 Regular astigmatism, bilateral: Secondary | ICD-10-CM | POA: Diagnosis not present

## 2022-10-22 DIAGNOSIS — H0014 Chalazion left upper eyelid: Secondary | ICD-10-CM | POA: Diagnosis not present

## 2022-11-12 DIAGNOSIS — Z6827 Body mass index (BMI) 27.0-27.9, adult: Secondary | ICD-10-CM | POA: Diagnosis not present

## 2022-11-12 DIAGNOSIS — J0100 Acute maxillary sinusitis, unspecified: Secondary | ICD-10-CM | POA: Diagnosis not present

## 2023-09-23 DIAGNOSIS — Z6823 Body mass index (BMI) 23.0-23.9, adult: Secondary | ICD-10-CM | POA: Diagnosis not present

## 2023-09-23 DIAGNOSIS — F411 Generalized anxiety disorder: Secondary | ICD-10-CM | POA: Diagnosis not present

## 2023-10-09 DIAGNOSIS — F411 Generalized anxiety disorder: Secondary | ICD-10-CM | POA: Diagnosis not present

## 2023-10-09 DIAGNOSIS — Z6823 Body mass index (BMI) 23.0-23.9, adult: Secondary | ICD-10-CM | POA: Diagnosis not present

## 2024-01-18 ENCOUNTER — Encounter (HOSPITAL_BASED_OUTPATIENT_CLINIC_OR_DEPARTMENT_OTHER): Payer: Self-pay

## 2024-01-18 ENCOUNTER — Ambulatory Visit (HOSPITAL_BASED_OUTPATIENT_CLINIC_OR_DEPARTMENT_OTHER)
Admission: EM | Admit: 2024-01-18 | Discharge: 2024-01-18 | Disposition: A | Discharge status: Home / Self Care | Attending: Family Medicine | Admitting: Family Medicine

## 2024-01-18 DIAGNOSIS — M25562 Pain in left knee: Secondary | ICD-10-CM | POA: Diagnosis not present

## 2024-01-18 DIAGNOSIS — Z23 Encounter for immunization: Secondary | ICD-10-CM

## 2024-01-18 DIAGNOSIS — S81012A Laceration without foreign body, left knee, initial encounter: Secondary | ICD-10-CM | POA: Diagnosis not present

## 2024-01-18 MED ORDER — MUPIROCIN 2 % EX OINT: 1.0000 | TOPICAL_OINTMENT | Freq: Two times a day (BID) | CUTANEOUS | 0 refills | Status: AC

## 2024-01-18 MED ORDER — TETANUS-DIPHTH-ACELL PERTUSSIS 5-2.5-18.5 LF-MCG/0.5 IM SUSY
0.5000 mL | PREFILLED_SYRINGE | Freq: Once | INTRAMUSCULAR | Status: AC
  Administered 2024-01-18: 0.5 mL via INTRAMUSCULAR

## 2024-01-18 MED ORDER — CEPHALEXIN 500 MG PO CAPS: 1000.0000 mg | ORAL_CAPSULE | Freq: Two times a day (BID) | ORAL | 0 refills | Status: AC

## 2024-01-18 NOTE — ED Provider Notes (Signed)
 [facility]    CSN: [id number] Arrival date & time: 01/18/24  1548      History   Chief Complaint Chief Complaint  Patient presents with   Laceration    HPI Franklin Adams is a 43 y.o. male.   43 year old male who cut his left knee below the patella when a chainsaw kicked back and hit him in the leg.  This happened at approximately 230 or 245 on 01/18/2024.  He has great range of motion of his left knee and it seems to be a superficial cut.  He had blue jeans on and it went through the blue jeans but it is not a deep cut.  It has not bled a lot and he is hoping to get stitches and go home.  He is not sure when he had his left last tetanus shot and feels like it may have been in the last 5 years but it could be longer.  He is willing to get a tetanus shot today.   Laceration Associated symptoms: no fever and no rash     Past Medical History:  Diagnosis Date   Chest pain in adult 12/15/2018   GERD (gastroesophageal reflux disease) 12/15/2018   Palpitations    Stress 12/15/2018   Tachycardia     Patient Active Problem List   Diagnosis Date Noted   Tachycardia    GERD (gastroesophageal reflux disease) 12/15/2018   Chest pain in adult 12/15/2018   Stress 12/15/2018   Palpitations     Past Surgical History:  Procedure Laterality Date   HAND SURGERY     LEG SURGERY         Home Medications    Prior to Admission medications   Medication Sig Start Date End Date Taking? Authorizing Provider  cephALEXin  (KEFLEX ) 500 MG capsule Take 2 capsules (1,000 mg total) by mouth 2 (two) times daily for 5 days. 01/18/24 01/23/24 Yes Ival Domino, FNP  mupirocin  ointment (BACTROBAN ) 2 % Apply 1 Application topically 2 (two) times daily. 01/18/24  Yes Ival Domino, FNP    Family History Family History  Problem Relation Age of Onset   Prostate cancer Maternal Grandfather    Other Mother        Murmur (Slight)    Social History Social History   Tobacco Use   Smoking  status: Never   Smokeless tobacco: Former  Building services engineer status: Never Used  Substance Use Topics   Alcohol use: Yes    Comment: occasional   Drug use: Not Currently     Allergies   Patient has no known allergies.   Review of Systems Review of Systems  Constitutional:  Negative for fever.  Respiratory:  Negative for cough.   Cardiovascular:  Negative for chest pain.  Gastrointestinal:  Negative for abdominal pain, constipation, diarrhea, nausea and vomiting.  Musculoskeletal:  Negative for arthralgias and back pain.  Skin:  Positive for wound (Left lower leg laceration just below the knee.). Negative for color change and rash.  Neurological:  Negative for syncope.  All other systems reviewed and are negative.    Physical Exam Triage Vital Signs ED Triage Vitals  Encounter Vitals Group     BP 01/18/24 1600 (P) 128/74     Girls Systolic BP Percentile --      Girls Diastolic BP Percentile --      Boys Systolic BP Percentile --      Boys Diastolic BP Percentile --  Pulse Rate 01/18/24 1600 (P) 92     Resp 01/18/24 1600 (P) 16     Temp 01/18/24 1600 (P) 98.3 F (36.8 C)     Temp Source 01/18/24 1600 (P) Oral     SpO2 01/18/24 1600 (P) 97 %     Weight --      Height --      Head Circumference --      Peak Flow --      Pain Score 01/18/24 1559 0     Pain Loc --      Pain Education --      Exclude from Growth Chart --    No data found.  Updated Vital Signs BP (P) 128/74 (BP Location: Left Arm)   Pulse (P) 92   Temp (P) 98.3 F (36.8 C) (Oral)   Resp (P) 16   SpO2 (P) 97%   Visual Acuity Right Eye Distance:   Left Eye Distance:   Bilateral Distance:    Right Eye Near:   Left Eye Near:    Bilateral Near:     Physical Exam Vitals and nursing note reviewed.  Constitutional:      General: He is not in acute distress.    Appearance: He is well-developed. He is not ill-appearing or toxic-appearing.  HENT:     Head: Normocephalic and atraumatic.      Right Ear: External ear normal.     Left Ear: External ear normal.     Nose: Nose normal.     Mouth/Throat:     Lips: Pink.     Mouth: Mucous membranes are moist.  Eyes:     Conjunctiva/sclera: Conjunctivae normal.     Pupils: Pupils are equal, round, and reactive to light.  Cardiovascular:     Rate and Rhythm: Normal rate and regular rhythm.     Heart sounds: S1 normal and S2 normal. No murmur heard. Pulmonary:     Effort: Pulmonary effort is normal. No respiratory distress.     Breath sounds: Normal breath sounds. No decreased breath sounds, wheezing, rhonchi or rales.  Musculoskeletal:        General: No swelling.  Skin:    General: Skin is warm and dry.     Capillary Refill: Capillary refill takes less than 2 seconds.     Findings: Laceration (See comments for more information) present. No rash.     Comments: Left lower leg laceration just below the patella anteriorly.  The laceration is between 3 and 4 cm long and is approximately 3 mm wide and approximately 2 mm deep.  There does not appear to be any foreign material in the wound.  It does show fresh blood but is not frankly bleeding.  The patient has full range of motion of his left knee without any pain except for at the laceration.  See photos for more information.  Neurological:     Mental Status: He is alert and oriented to person, place, and time.  Psychiatric:        Mood and Affect: Mood normal.         UC Treatments / Results  Labs (all labs ordered are listed, but only abnormal results are displayed) Labs Reviewed - No data to display  EKG   Radiology No results found.  Procedures Laceration Repair  Date/Time: 01/18/2024 5:00 PM  Performed by: Ival Domino, FNP Authorized by: Ival Domino, FNP   Consent:    Consent obtained:  Verbal   Consent given by:  Patient   Risks, benefits, and alternatives were discussed: yes     Risks discussed:  Infection, need for additional repair, nerve damage,  poor wound healing, poor cosmetic result, pain, retained foreign body, tendon damage and vascular damage   Alternatives discussed:  No treatment, delayed treatment, observation and referral Universal protocol:    Procedure explained and questions answered to patient or proxy's satisfaction: yes     Immediately prior to procedure, a time out was called: yes     Patient identity confirmed:  Verbally with patient Anesthesia:    Anesthesia method:  Local infiltration   Local anesthetic:  Lidocaine 1% WITH epi Laceration details:    Location:  Leg   Leg location:  L knee   Length (cm):  3.5   Depth (mm):  2 Pre-procedure details:    Preparation:  Patient was prepped and draped in usual sterile fashion Exploration:    Limited defect created (wound extended): no     Hemostasis achieved with:  Direct pressure   Imaging outcome: foreign body not noted     Wound exploration: wound explored through full range of motion and entire depth of wound visualized     Contaminated: no (No obvious contamination but this occurred out in the woods at a tree.  The chainsaw was not clean.)   Treatment:    Area cleansed with:  Chlorhexidine   Amount of cleaning:  Extensive   Irrigation solution:  Sterile saline   Irrigation volume:  30 mL   Irrigation method:  Syringe   Visualized foreign bodies/material removed: no     Debridement:  None   Undermining:  None   Scar revision: no   Skin repair:    Repair method:  Sutures   Suture size:  3-0   Wound skin closure material used: Silk.   Suture technique:  Simple interrupted   Number of sutures:  7 Approximation:    Approximation:  Close Repair type:    Repair type:  Simple  (including critical care time)  Medications Ordered in UC Medications  Tdap (BOOSTRIX) injection 0.5 mL (0.5 mLs Intramuscular Given 01/18/24 1653)    Initial Impression / Assessment and Plan / UC Course  I have reviewed the triage vital signs and the nursing notes.  Pertinent  labs & imaging results that were available during my care of the patient were reviewed by me and considered in my medical decision making (see chart for details).  Plan of Care: Laceration to left knee with left knee pain: Laceration sutured and see procedure for more information.  See discharge instructions for wound care.  Use mupirocin  on the wound once or twice daily.  Cephalexin  500 mg, 2 pills twice daily for 5 days.  Use acetaminophen or ibuprofen as needed for pain.  Tetanus booster given today.  Return in 10 days for suture removal.  Reviewed signs and symptoms of worsening condition and reasons to return sooner or reasons that indicate an infection.  Follow-up if symptoms do not improve, worsen or new symptoms occur.  I reviewed the plan of care with the patient and/or the patient's guardian.  The patient and/or guardian had time to ask questions and acknowledged that the questions were answered.  I provided instruction on symptoms or reasons to return here or to go to an ER, if symptoms/condition did not improve, worsened or if new symptoms occurred.  Final Clinical Impressions(s) / UC Diagnoses   Final diagnoses:  Laceration of left knee, initial encounter  Acute pain of left knee     Discharge Instructions      Laceration of left knee with left knee pain: This is a superficial laceration into the epidermis but not through to the viscera or ligaments or muscle.  Irrigated well and no foreign bodies found.  Sutured.  Clean daily or even twice daily with warm soapy fingers, rinse, pat dry, apply mupirocin  ointment and a bandage if needed.  Try to keep it clean as it heals.  Cephalexin , 500 mg, 2 pills twice daily for 5 days.  Use acetaminophen or ibuprofen if needed for pain.  Sutures need to come out in 10 days.  Return here for suture removal.  Tetanus shot given.  Follow-up if symptoms do not improve, worsen or new symptoms occur.    ED Prescriptions     Medication Sig  Dispense Auth. Provider   mupirocin  ointment (BACTROBAN ) 2 % Apply 1 Application topically 2 (two) times daily. 22 g Ival Domino, FNP   cephALEXin  (KEFLEX ) 500 MG capsule Take 2 capsules (1,000 mg total) by mouth 2 (two) times daily for 5 days. 20 capsule Ival Domino, FNP      PDMP not reviewed this encounter.   Ival Domino, FNP 01/18/24 1710

## 2024-01-18 NOTE — Discharge Instructions (Addendum)
 Laceration of left knee with left knee pain: This is a superficial laceration into the epidermis but not through to the viscera or ligaments or muscle.  Irrigated well and no foreign bodies found.  Sutured.  Clean daily or even twice daily with warm soapy fingers, rinse, pat dry, apply mupirocin  ointment and a bandage if needed.  Try to keep it clean as it heals.  Cephalexin , 500 mg, 2 pills twice daily for 5 days.  Use acetaminophen or ibuprofen if needed for pain.  Sutures need to come out in 10 days.  Return here for suture removal.  Tetanus shot given.  Follow-up if symptoms do not improve, worsen or new symptoms occur.

## 2024-01-18 NOTE — ED Triage Notes (Signed)
 45 mins ago,chainsaw to the left knee About two inches in length Cleaned it at home with rubbing alcohol
